# Patient Record
Sex: Male | Born: 1975 | Race: White | Hispanic: No | Marital: Married
Health system: Southern US, Community
[De-identification: ages and names within clinical notes are randomized; demographics above are authoritative.]

---

## 2002-12-06 ENCOUNTER — Emergency Department (HOSPITAL_COMMUNITY): Admission: EM | Admit: 2002-12-06 | Discharge: 2002-12-06 | Payer: Self-pay | Admitting: Emergency Medicine

## 2003-09-07 ENCOUNTER — Emergency Department (HOSPITAL_COMMUNITY): Admission: EM | Admit: 2003-09-07 | Discharge: 2003-09-07 | Payer: Self-pay | Admitting: Family Medicine

## 2003-09-12 ENCOUNTER — Emergency Department (HOSPITAL_COMMUNITY): Admission: EM | Admit: 2003-09-12 | Discharge: 2003-09-12 | Payer: Self-pay | Admitting: Family Medicine

## 2009-04-22 ENCOUNTER — Emergency Department (HOSPITAL_COMMUNITY): Admission: EM | Admit: 2009-04-22 | Discharge: 2009-04-22 | Payer: Self-pay | Admitting: Emergency Medicine

## 2009-05-08 ENCOUNTER — Emergency Department (HOSPITAL_COMMUNITY): Admission: EM | Admit: 2009-05-08 | Discharge: 2009-05-08 | Payer: Self-pay | Admitting: Emergency Medicine

## 2010-07-24 LAB — CBC
HCT: 42.8 % (ref 39.0–52.0)
Hemoglobin: 14.5 g/dL (ref 13.0–17.0)
Platelets: 233 10*3/uL (ref 150–400)
RDW: 13.2 % (ref 11.5–15.5)

## 2010-07-24 LAB — DIFFERENTIAL
Lymphocytes Relative: 8 % — ABNORMAL LOW (ref 12–46)
Monocytes Absolute: 0.9 10*3/uL (ref 0.1–1.0)
Neutrophils Relative %: 84 % — ABNORMAL HIGH (ref 43–77)

## 2011-12-25 IMAGING — CR DG ELBOW COMPLETE 3+V*L*
5 series · 5 of 5 positions shown · non-contrast
Comparison: 04/22/2009

CLINICAL DATA: Laceration, infection

LEFT ELBOW - COMPLETE 3+ VIEW

[x elbow joint ap left]
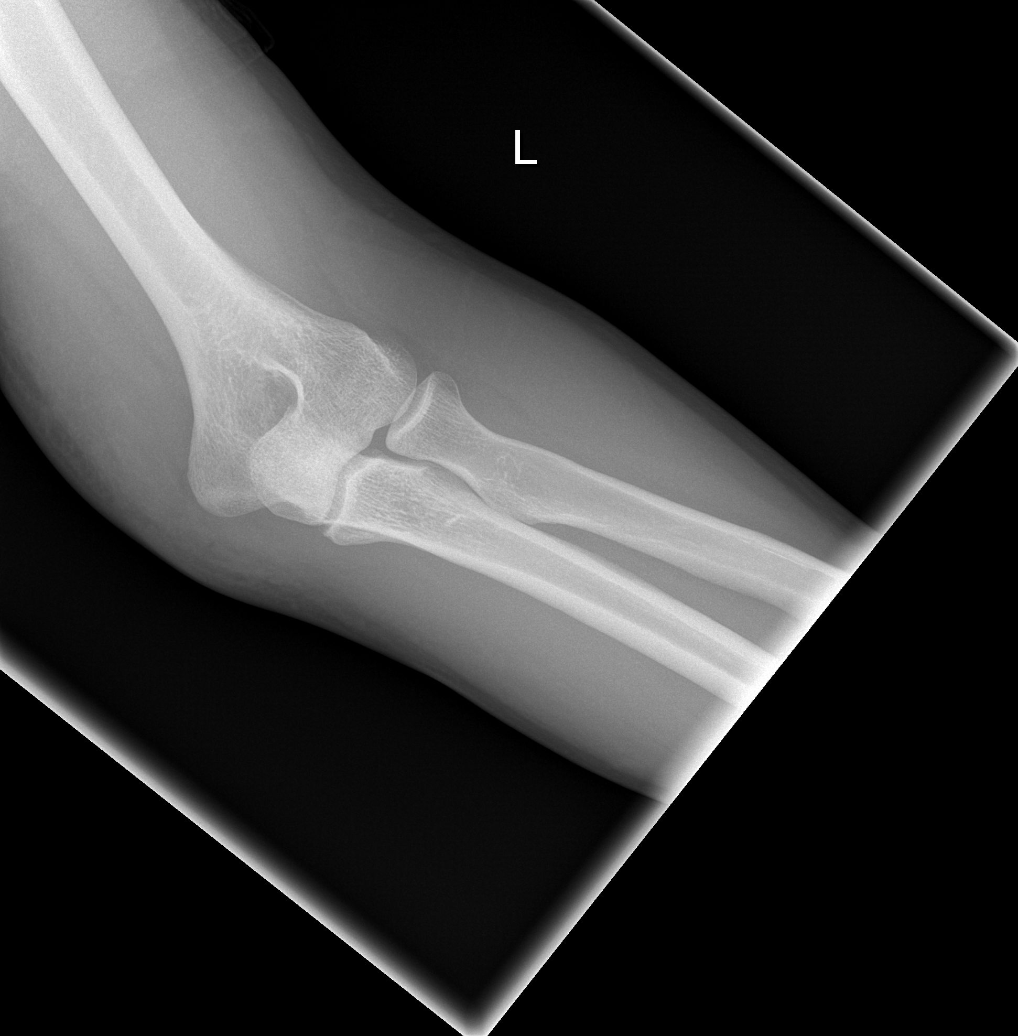

[x elbow joint obl. left (1 of 3)]
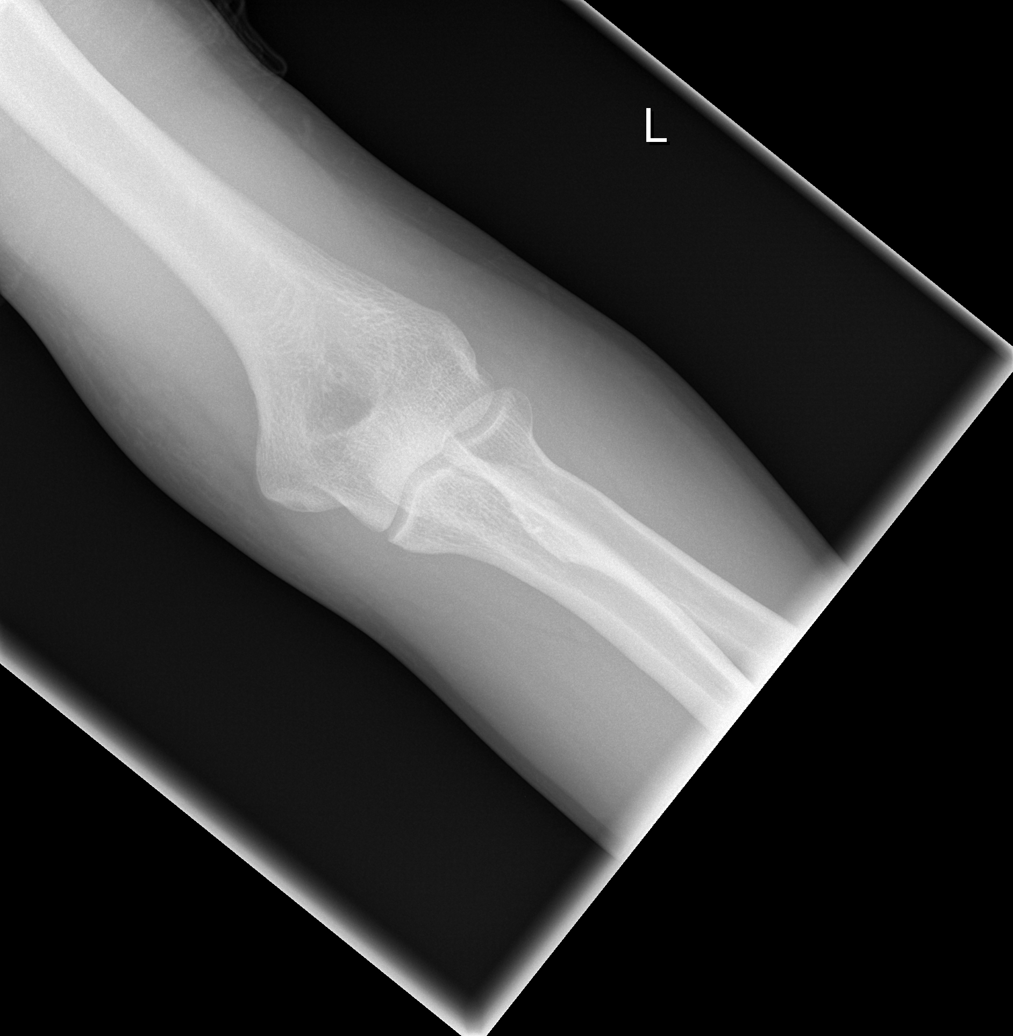

[x elbow joint obl. left (2 of 3)]
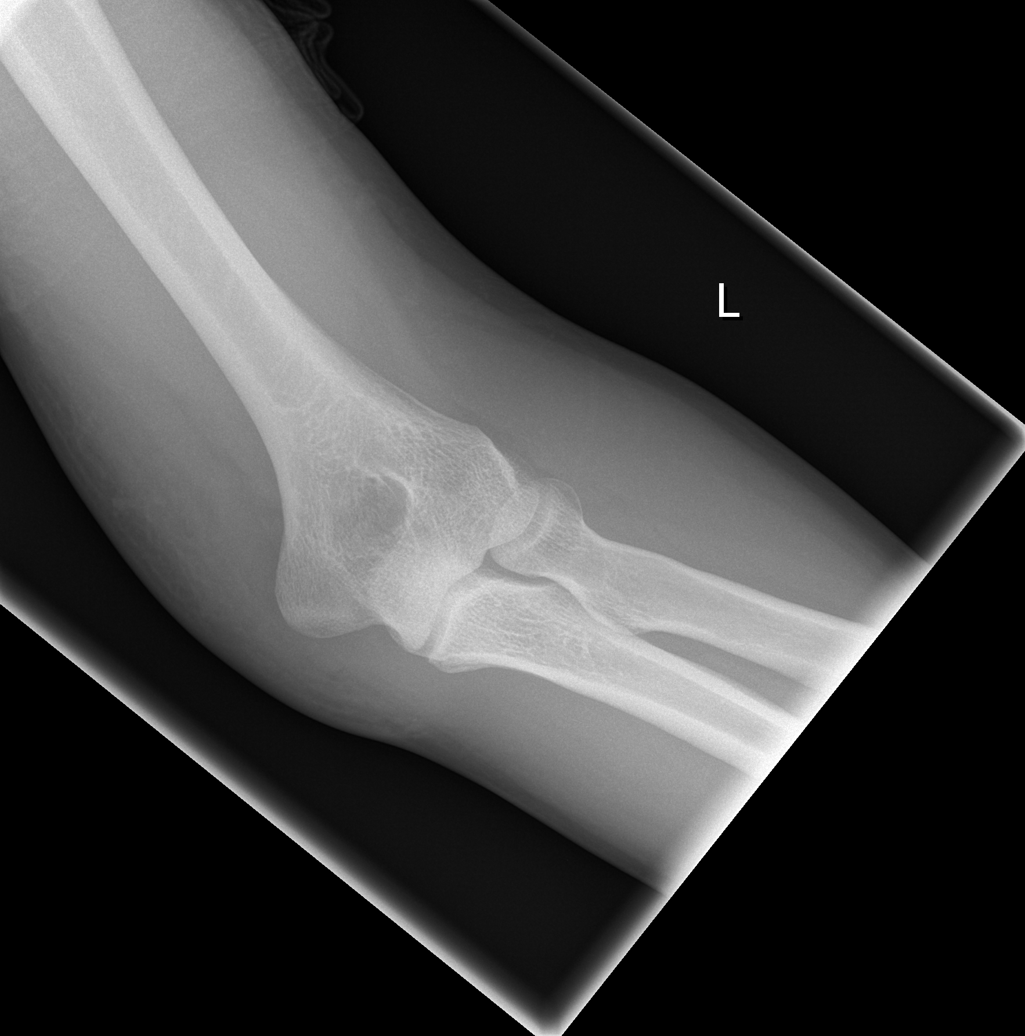

[x elbow joint obl. left (3 of 3)]
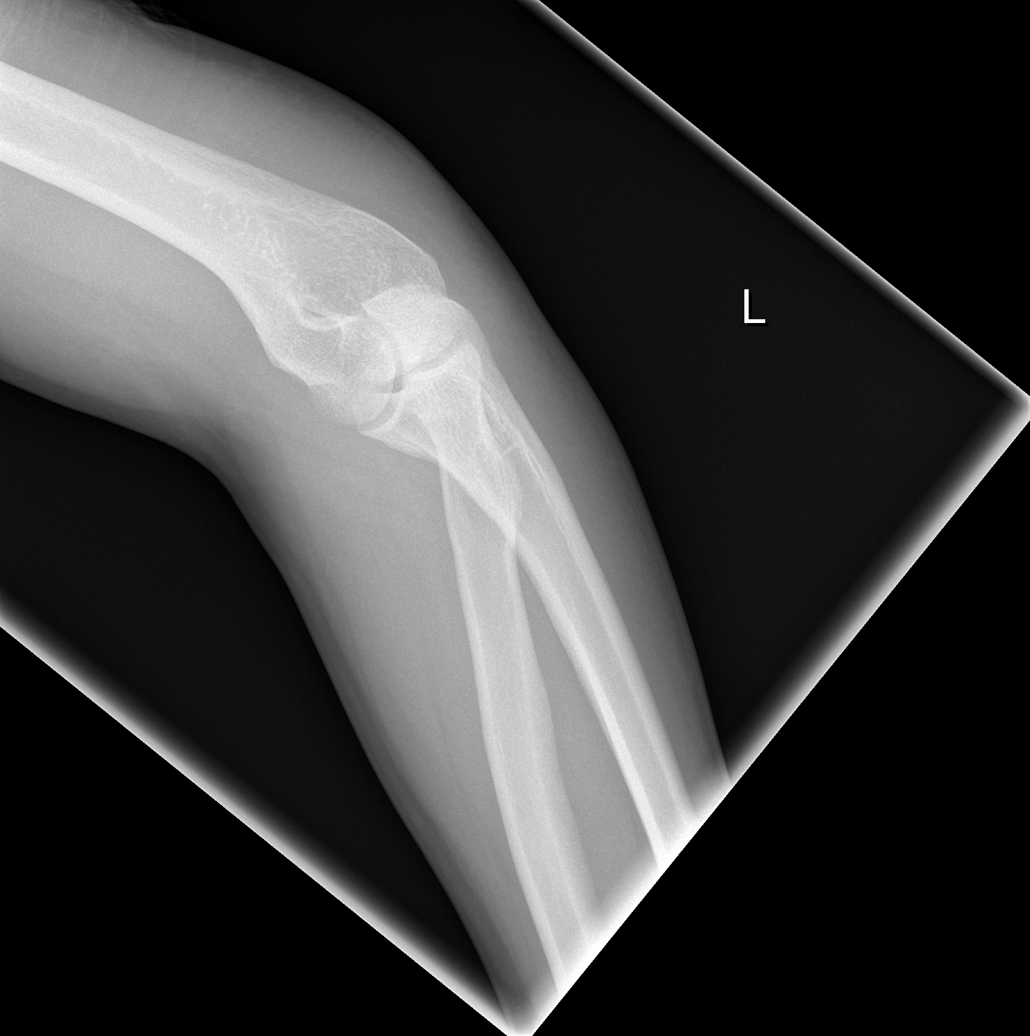

[x elbow joint lat left]
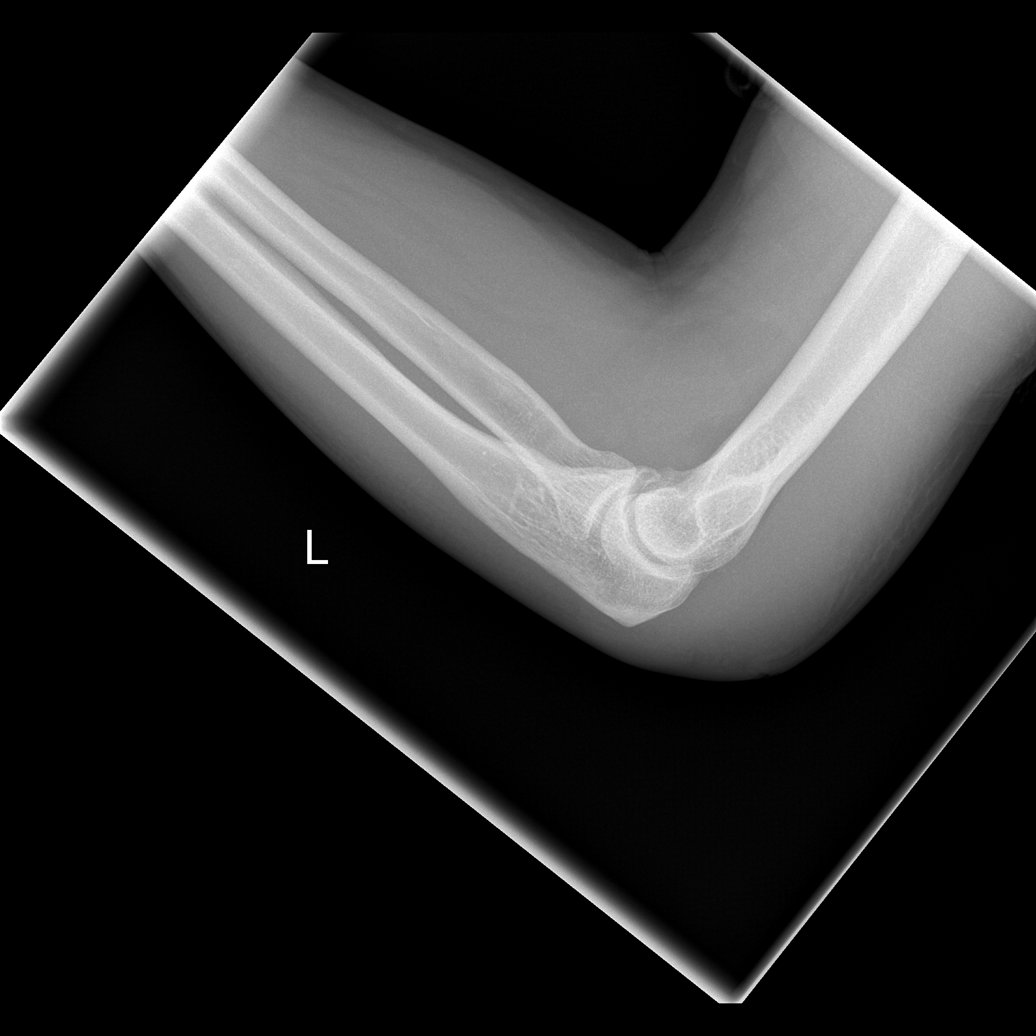

[5 of 5 positions shown; findings below may reference images not displayed]

FINDINGS: Normal alignment.  No fracture.  Increased posterior
olecranon region soft tissue swelling/edema.
IMPRESSION: Worsening olecranon soft tissue swelling.
No acute bony abnormality

## 2017-02-26 ENCOUNTER — Ambulatory Visit (INDEPENDENT_AMBULATORY_CARE_PROVIDER_SITE_OTHER): Payer: Self-pay | Admitting: Emergency Medicine

## 2017-02-26 DIAGNOSIS — Z23 Encounter for immunization: Secondary | ICD-10-CM

## 2017-02-26 NOTE — Patient Instructions (Signed)

## 2017-02-26 NOTE — Progress Notes (Signed)
Patient is here for Influenza vaccination only.

## 2017-08-15 ENCOUNTER — Ambulatory Visit: Payer: Self-pay | Admitting: Nurse Practitioner

## 2017-08-15 VITALS — BP 122/82 | HR 95 | Temp 98.8°F | Resp 18

## 2017-08-15 DIAGNOSIS — I878 Other specified disorders of veins: Secondary | ICD-10-CM

## 2017-08-15 MED ORDER — CHLORTHALIDONE 25 MG PO TABS: 12.5000 mg | ORAL_TABLET | Freq: Every day | ORAL | 0 refills | Status: AC

## 2017-08-15 NOTE — Patient Instructions (Signed)
Edema Edema is an abnormal buildup of fluids in your bodytissues. Edema is somewhatdependent on gravity to pull the fluid to the lowest place in your body. That makes the condition more common in the legs and thighs (lower extremities). Painless swelling of the feet and ankles is common and becomes more likely as you get older. It is also common in looser tissues, like around your eyes. When the affected area is squeezed, the fluid may move out of that spot and leave a dent for a few moments. This dent is called pitting. What are the causes? There are many possible causes of edema. Eating too much salt and being on your feet or sitting for a long time can cause edema in your legs and ankles. Hot weather may make edema worse. Common medical causes of edema include:  Heart failure.  Liver disease.  Kidney disease.  Weak blood vessels in your legs.  Cancer.  An injury.  Pregnancy.  Some medications.  Obesity.  What are the signs or symptoms? Edema is usually painless.Your skin may look swollen or shiny. How is this diagnosed? Your health care provider may be able to diagnose edema by asking about your medical history and doing a physical exam. You may need to have tests such as X-rays, an electrocardiogram, or blood tests to check for medical conditions that may cause edema. How is this treated? Edema treatment depends on the cause. If you have heart, liver, or kidney disease, you need the treatment appropriate for these conditions. General treatment may include:  Elevation of the affected body part above the level of your heart.  Compression of the affected body part. Pressure from elastic bandages or support stockings squeezes the tissues and forces fluid back into the blood vessels. This keeps fluid from entering the tissues.  Restriction of fluid and salt intake.  Use of a water pill (diuretic). These medications are appropriate only for some types of edema. They pull fluid  out of your body and make you urinate more often. This gets rid of fluid and reduces swelling, but diuretics can have side effects. Only use diuretics as directed by your health care provider.  Follow these instructions at home:  Keep the affected body part above the level of your heart when you are lying down.  Do not sit still or stand for prolonged periods.  Do not put anything directly under your knees when lying down.  Do not wear constricting clothing or garters on your upper legs.  Exercise your legs to work the fluid back into your blood vessels. This may help the swelling go down.  Wear elastic bandages or support stockings to reduce ankle swelling as directed by your health care provider.  Eat a low-salt diet to reduce fluid if your health care provider recommends it.  Only take medicines as directed by your health care provider.  Purchase TED hose and wear during the day when you are expected to be standing or sitting for long periods of time  Incorporate cardio into your work out.  Improve your diet. Contact a health care provider if:  Your edema is not responding to treatment.  You have heart, liver, or kidney disease and notice symptoms of edema.  You have edema in your legs that does not improve after elevating them.  You have sudden and unexplained weight gain. Get help right away if:  You develop shortness of breath or chest pain.  You cannot breathe when you lie down.  You develop  pain, redness, or warmth in the swollen areas.  You have heart, liver, or kidney disease and suddenly get edema.  You have a fever and your symptoms suddenly get worse. This information is not intended to replace advice given to you by your health care provider. Make sure you discuss any questions you have with your health care provider. Document Released: 04/24/2005 Document Revised: 09/30/2015 Document Reviewed: 02/14/2013 Elsevier Interactive Patient Education  2017  [employer].

## 2017-08-15 NOTE — Progress Notes (Addendum)
   Subjective:    Patient ID: Frederick Mcclure, male    DOB: May 15, 1975, 42 y.o.   MRN: [id number]  The patient is a 42 year old male that presents today with complaints of bilateral lower extremity/foot swelling.  Patient states he noticed this Sunday after he had been standing for a long period of time.  Patient states he noticed worsening on Monday with redness around his ankles.  Patient did elevate his feet which he noticed did help improve the swelling however swelling did return today.  Patient also noticed swelling in his calves which has improved.  Patient denies fever chills shortness of breath difficulty breathing.  She denies any past medical history to include heart disease lung disease kidney disease diabetes or hypertension.  Patient's past medical history, allergies, and medications were reviewed.  Review of Systems  Constitutional: Negative.   HENT: Negative.   Eyes: Negative.   Respiratory: Negative.   Cardiovascular: Positive for leg swelling.       Bilateral leg and ankle swelling  Gastrointestinal: Negative.   Skin:       Erythema to bilateral ankles and feet      Objective:   Physical Exam  Constitutional: He appears well-developed and well-nourished. No distress.  HENT:  Head: Normocephalic and atraumatic.  Eyes: Pupils are equal, round, and reactive to light. Conjunctivae and EOM are normal.  Neck: Normal range of motion. Neck supple. No thyromegaly present.  Cardiovascular: Normal rate, regular rhythm and normal heart sounds.  Pulmonary/Chest: Effort normal and breath sounds normal. No respiratory distress.  Abdominal: Soft. Bowel sounds are normal. He exhibits no distension. There is no tenderness.  Musculoskeletal: He exhibits edema.  Erythematous bilateral lower extremities, DP and PT pulses intact bilaterally.  Negative Homans signs.  Neurovascular status intact bilaterally to lower extremities   Orthostatic blood pressures performed  Lying  down-128/70 Sitting-118/78 Standing-118/70  Consulted with Dr. Lovell SheehanJenkins regarding this patient's status.  Dr. Lovell SheehanJenkins recommended chlorthalidone 12.5 mg.  Patient to follow-up with PCP as he is not a current employee.     Assessment & Plan:  Edema/venous stasis 1.  Chlorthalidone 12.5 mg once daily for the next 10 days. 2.  Patient instructed to elevate lower extremities above the level of the heart when at rest. 3.  Patient instructed to purchase TED hose to wear during the day. 4.  Patient instructed to stop on long trips when driving to rest 5.  Patient instructed to increase cardiovascular exercise. 6.  Patient instructed to improve diet to avoid long-term blood pressure medications. 7.Patient verbalizes understanding and has no questions at time of discharge. Meds ordered this encounter  Medications  . chlorthalidone (HYGROTON) 25 MG tablet    Sig: Take 0.5 tablets (12.5 mg total) by mouth daily for 10 days.    Dispense:  5 tablet    Refill:  0    Order Specific Question:   Supervising Provider    Answer:   Stacie GlazeJENKINS, JOHN E [phone][[id number]]

## 2017-08-29 ENCOUNTER — Telehealth: Payer: Self-pay

## 2017-08-29 NOTE — Telephone Encounter (Signed)
Pt called back and states he is doing fine.
